# Patient Record
Sex: Male | Born: 1990 | Race: White | Hispanic: No | Marital: Married | Smoking: Never smoker
Health system: Southern US, Community
[De-identification: ages and names within clinical notes are randomized; demographics above are authoritative.]

## PROBLEM LIST (undated history)

## (undated) DIAGNOSIS — J45909 Unspecified asthma, uncomplicated: Secondary | ICD-10-CM

---
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
## 2011-12-18 IMAGING — CR DG HAND COMPLETE 3+V*L*
1 series · 3 of 3 positions shown · non-contrast
Comparison: none

REASON FOR EXAM: foreign body; pt in [place]
COMMENTS:

[Series 1: view not recorded · 0.17mm/px · 3 of 3 slices shown]
[im 1/3]
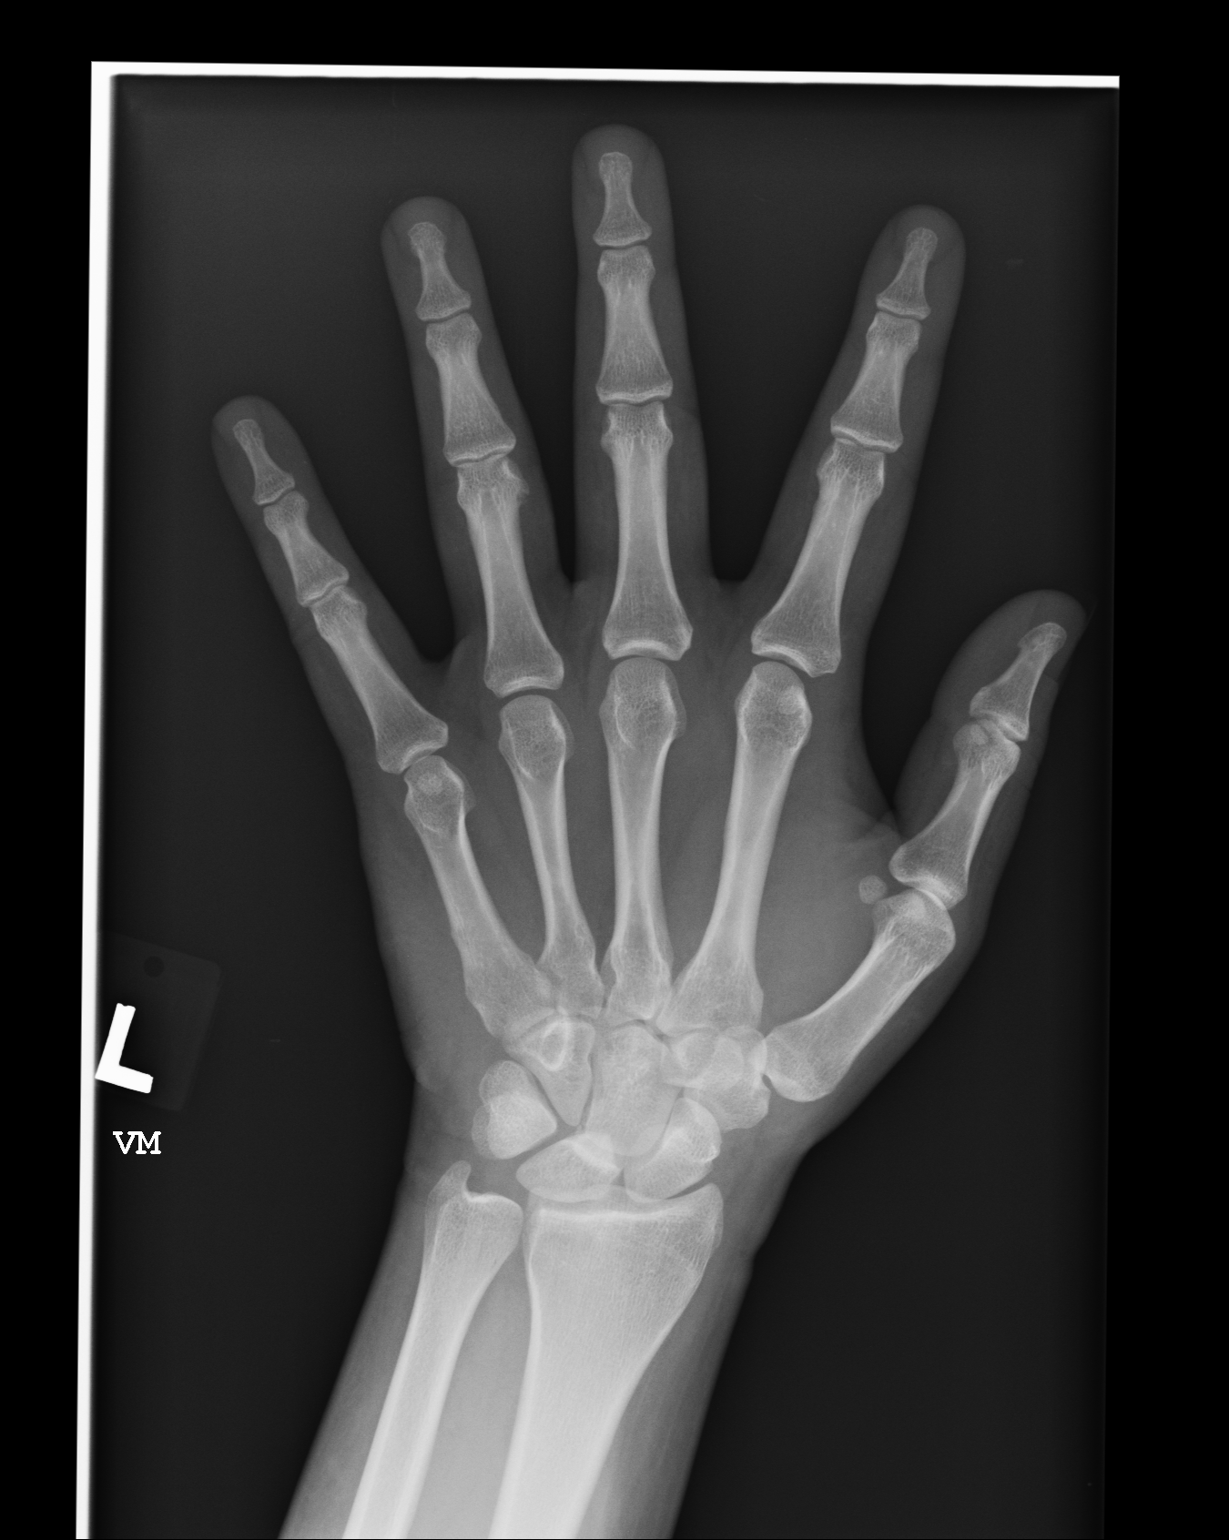
[im 2/3]
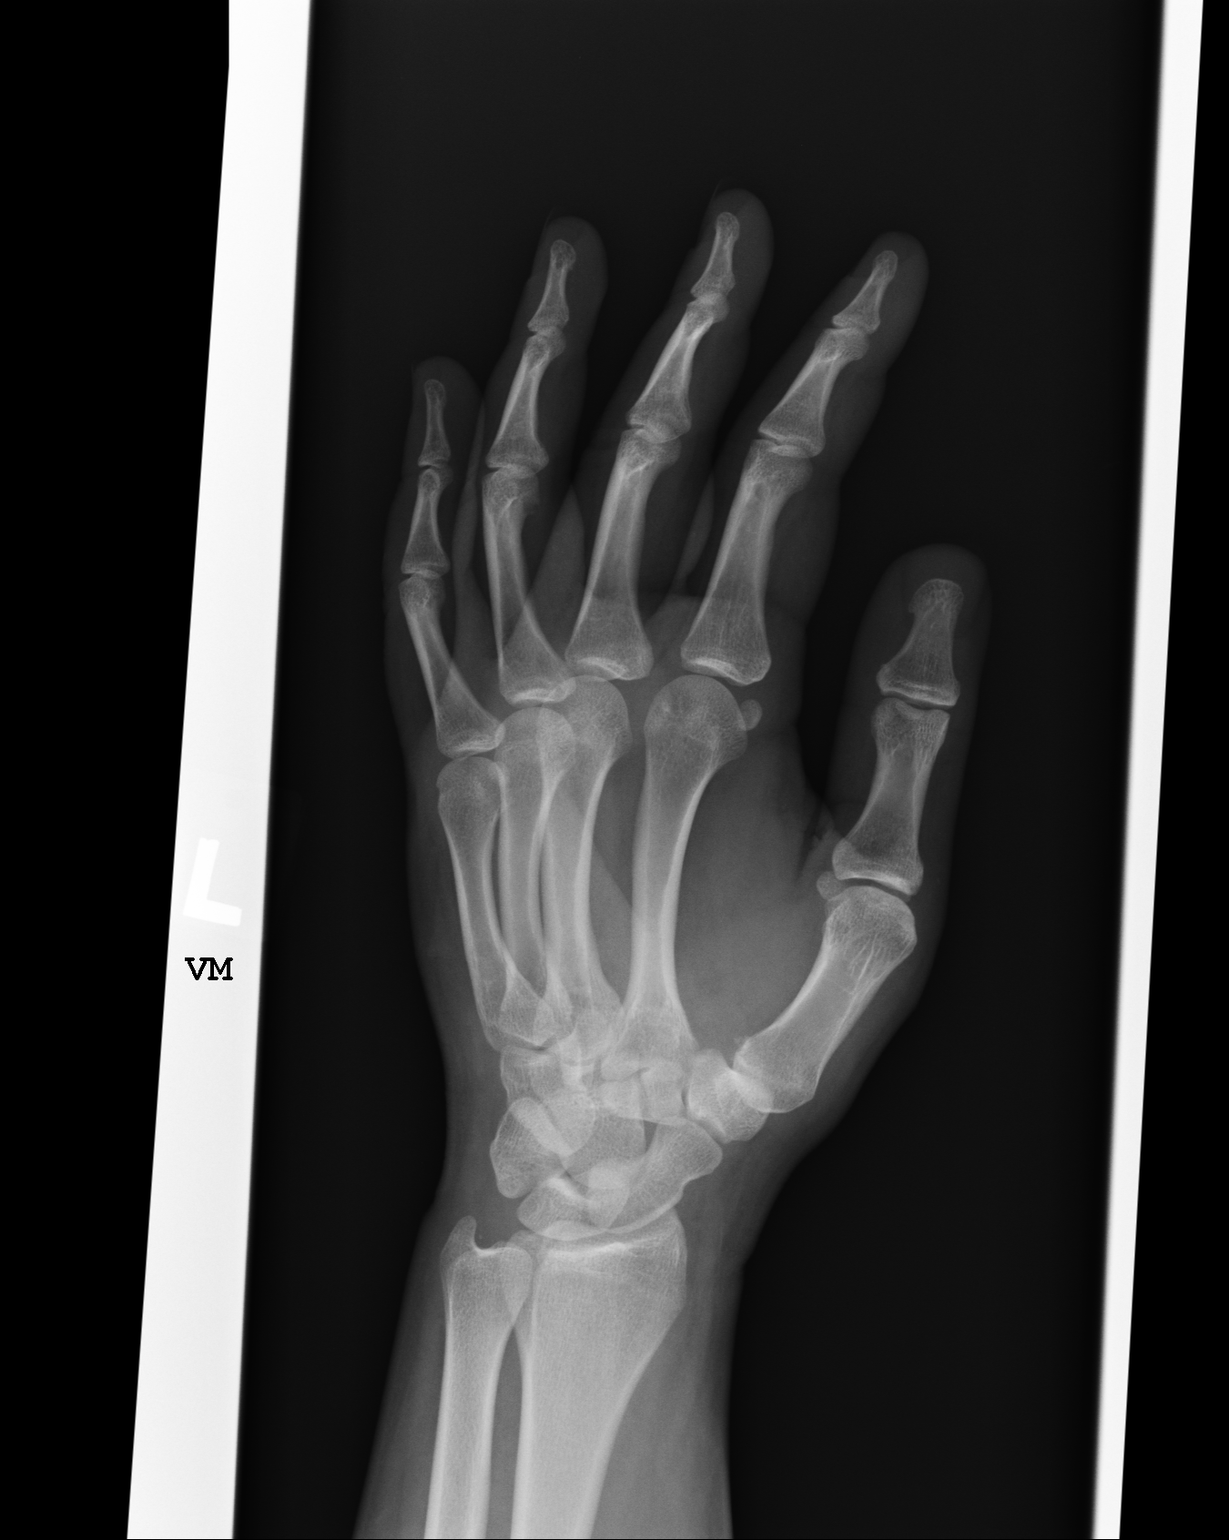
[im 3/3]
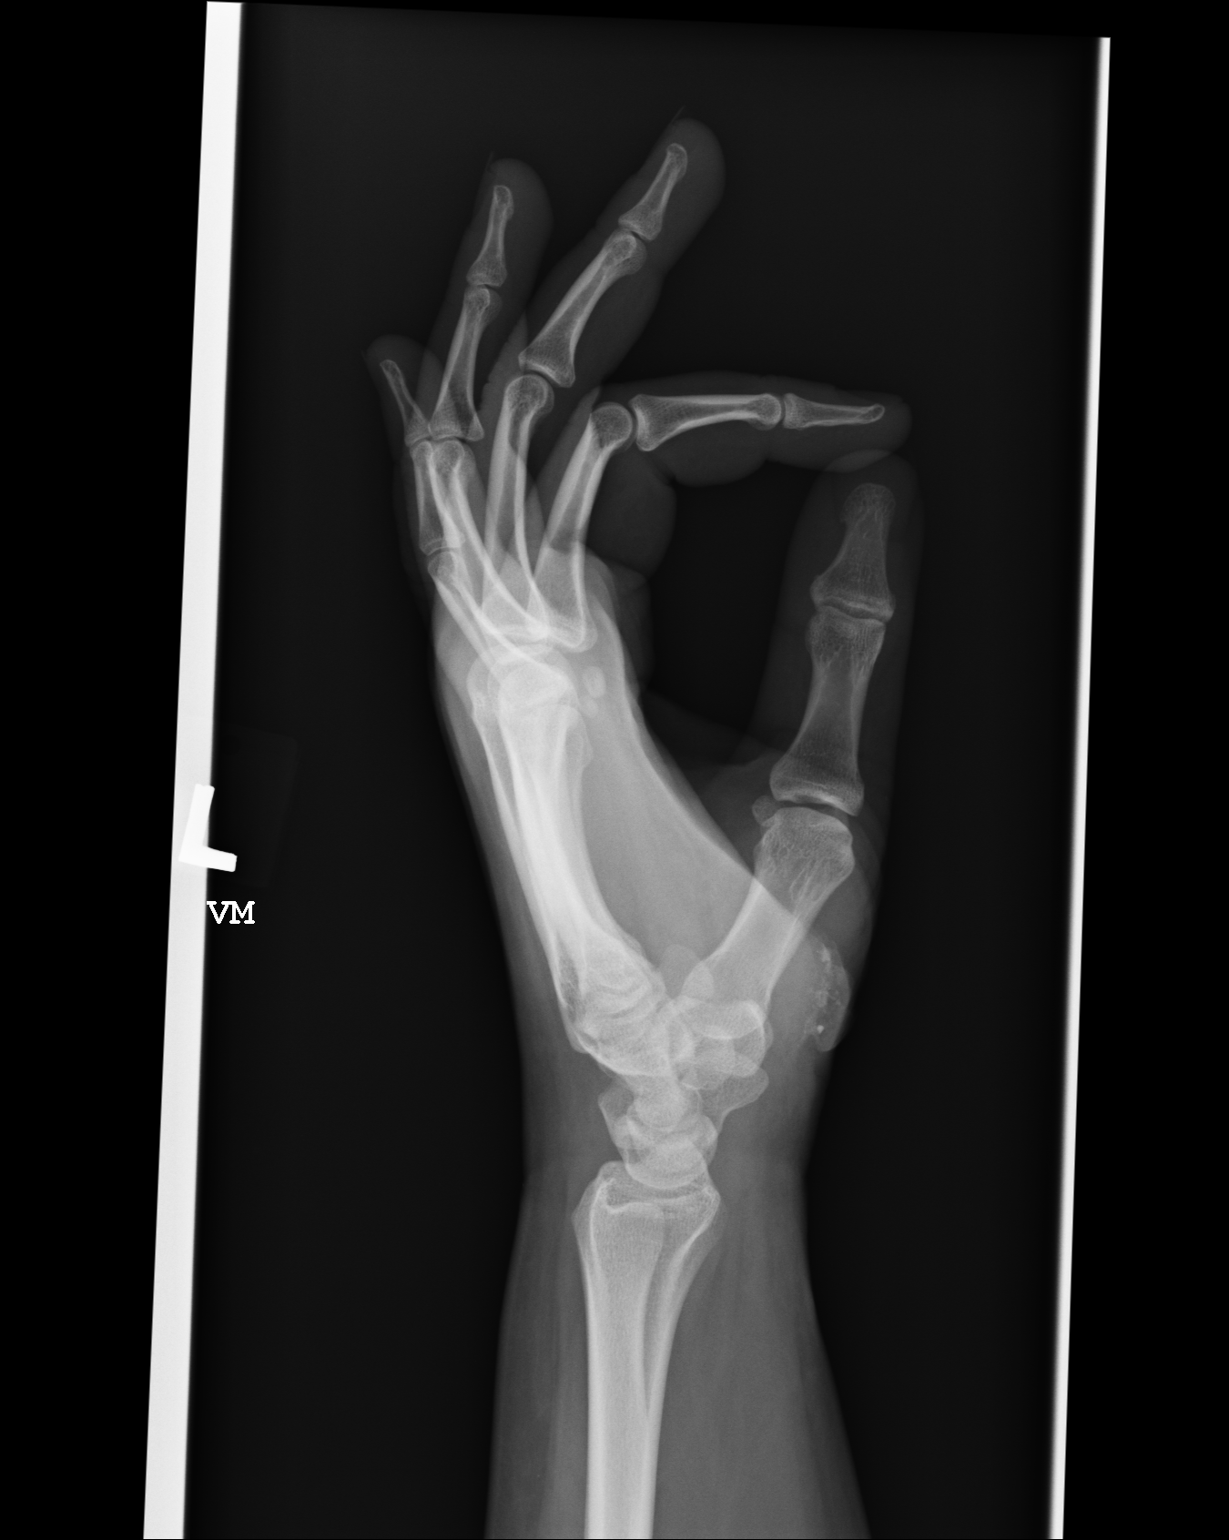

[3 of 3 positions shown; findings below may reference images not displayed]

PROCEDURE:     DXR - DXR HAND LT COMPLETE  W/OBLIQUES  - September 01, 2009 [DATE]

RESULT:     Three views of the left hand reveal the bones to be adequately
mineralized. I do not see evidence of acute bony abnormality. There is
radiodense material in the wound in the thenar region seen on the lateral
film.
IMPRESSION: 1. There may be retained foreign material in the region of the base of the
thumb. However, this is seen on one view only.
2. I do not see evidence of an underlying fracture.

## 2015-03-24 ENCOUNTER — Emergency Department
Admission: EM | Admit: 2015-03-24 | Discharge: 2015-03-24 | Disposition: A | Discharge status: Home / Self Care | Payer: Self-pay | Attending: Emergency Medicine | Admitting: Emergency Medicine

## 2015-03-24 ENCOUNTER — Encounter: Payer: Self-pay | Admitting: Emergency Medicine

## 2015-03-24 DIAGNOSIS — H109 Unspecified conjunctivitis: Secondary | ICD-10-CM | POA: Insufficient documentation

## 2015-03-24 DIAGNOSIS — J45909 Unspecified asthma, uncomplicated: Secondary | ICD-10-CM | POA: Insufficient documentation

## 2015-03-24 HISTORY — DX: Unspecified asthma, uncomplicated: J45.909

## 2015-03-24 MED ORDER — TOBRAMYCIN 0.3 % OP SOLN: 2.0000 [drp] | OPHTHALMIC | Status: AC

## 2015-03-24 NOTE — ED Notes (Signed)
AAOx3.  Skin warm and dry.  Sinus congestion noted.  NAD 

## 2015-03-24 NOTE — ED Notes (Signed)
Pt states about a week ago he started with a small cough, now thinks he has pink eye in both eyes.

## 2015-03-24 NOTE — Discharge Instructions (Signed)
Bacterial Conjunctivitis Bacterial conjunctivitis (commonly called pink eye) is redness, soreness, or puffiness (inflammation) of the white part of your eye. It is caused by a germ called bacteria. These germs can easily spread from person to person (contagious). Your eye often will become red or pink. Your eye may also become irritated, watery, or have a thick discharge.  HOME CARE   Apply a cool, clean washcloth over closed eyelids. Do this for 10-20 minutes, 3-4 times a day while you have pain.  Gently wipe away any fluid coming from the eye with a warm, wet washcloth or cotton ball.  Wash your hands often with soap and water. Use paper towels to dry your hands.  Do not share towels or washcloths.  Change or wash your pillowcase every day.  Do not use eye makeup until the infection is gone.  Do not use machines or drive if your vision is blurry.  Stop using contact lenses. Do not use them again until your doctor says it is okay.  Do not touch the tip of the eye drop bottle or medicine tube with your fingers when you put medicine on the eye. GET HELP RIGHT AWAY IF:   Your eye is not better after 3 days of starting your medicine.  You have a yellowish fluid coming out of the eye.  You have more pain in the eye.  Your eye redness is spreading.  Your vision becomes blurry.  You have a fever or lasting symptoms for more than 2-3 days.  You have a fever and your symptoms suddenly get worse.  You have pain in the face.  Your face gets red or puffy (swollen). MAKE SURE YOU:   Understand these instructions.  Will watch this condition.  Will get help right away if you are not doing well or get worse.   This information is not intended to replace advice given to you by your health care provider. Make sure you discuss any questions you have with your health care provider.   Document Released: 01/06/2008 Document Revised: 03/15/2012 Document Reviewed: 12/03/2011 Elsevier  Interactive Patient Education 2016 [employer]  

## 2015-03-24 NOTE — ED Provider Notes (Signed)
[facility] Emergency Department Provider Note  ____________________________________________  Time seen: Approximately 4:43 PM  I have reviewed the triage vital signs and the nursing notes.   HISTORY  Chief Complaint Cough and Conjunctivitis  HPI Wesley Harvey is a 24 y.o. male is here with complaint of pink eye in both his eyes. Patient states it started in his left eye and now beginning in his right eye. He states that drainage has decreased in his left that in the right eye today he is been getting yellow greenish drainage. He denies any vision changes or foreign bodies. He states he also has had a nonproductive cough and nasal congestion. He denies any fever or chills. He has not taken any over-the-counter medication. He denies any pain and rates it as 0 out of 10.   Past Medical History  Diagnosis Date  . Asthma     There are no active problems to display for this patient.   History reviewed. No pertinent past surgical history.  Current Outpatient Rx  Name  Route  Sig  Dispense  Refill  . tobramycin (TOBREX) 0.3 % ophthalmic solution   Right Eye   Place 2 drops into the right eye every 4 (four) hours. While awake   5 mL   0     Allergies Shellfish allergy  No family history on file.  Social History Social History  Substance Use Topics  . Smoking status: Never Smoker   . Smokeless tobacco: None  . Alcohol Use: Yes     Comment: occas.     Review of Systems Constitutional: No fever/chills Eyes: No visual changes. Positive exudate ENT: No sore throat. Cardiovascular: Denies chest pain. Respiratory: Denies shortness of breath. Positive nonproductive cough Gastrointestinal:  No nausea, no vomiting.   Skin: Negative for rash. Neurological: Negative for headaches, focal weakness or numbness.  10-point ROS otherwise negative.  ____________________________________________   PHYSICAL EXAM:  VITAL SIGNS: ED Triage Vitals  Enc  Vitals Group     BP 03/24/15 1633 147/91 mmHg     Pulse Rate 03/24/15 1633 79     Resp 03/24/15 1633 18     Temp 03/24/15 1633 98.4 F (36.9 C)     Temp Source 03/24/15 1633 Oral     SpO2 03/24/15 1633 99 %     Weight 03/24/15 1633 280 lb (127.007 kg)     Height 03/24/15 1633  (1.88 m)     Head Cir --      Peak Flow --      Pain Score --      Pain Loc --      Pain Edu? --      Excl. in GC? --     Constitutional: Alert and oriented. Well appearing and in no acute distress. Eyes: Right conjunctiva with mild injection and minimal exudate present. Left conjunctiva without exudate. There is minimal to no injection noted on the left side. PERRL. EOMI. Head: Atraumatic. Nose: Mild congestion/rhinnorhea.  EACs and TMs clear bilaterally. Mouth/Throat: Mucous membranes are moist.  Oropharynx non-erythematous. Neck: No stridor.   Hematological/Lymphatic/Immunilogical: No cervical lymphadenopathy. Cardiovascular: Normal rate, regular rhythm. Grossly normal heart sounds.  Good peripheral circulation. Respiratory: Normal respiratory effort.  No retractions. Lungs CTAB. Gastrointestinal: Soft and nontender. No distention. Musculoskeletal: No lower extremity tenderness nor edema.  No joint effusions. Neurologic:  Normal speech and language. No gross focal neurologic deficits are appreciated. No gait instability. Skin:  Skin is warm, dry and intact. No rash  noted. Psychiatric: Mood and affect are normal. Speech and behavior are normal.  ____________________________________________   LABS (all labs ordered are listed, but only abnormal results are displayed)  Labs Reviewed - No data to display ____________________________________________   PROCEDURES  Procedure(s) performed: None  Critical Care performed: No  ____________________________________________   INITIAL IMPRESSION / ASSESSMENT AND PLAN / ED COURSE  Pertinent labs & imaging results that were available during my care  of the patient were reviewed by me and considered in my medical decision making (see chart for details).  He was placed on Tobrex ophthalmic solution to be used in his right eye and to begin his left eye if any problems. Patient was given a note to remain out of work    FINAL CLINICAL IMPRESSION(S) / ED DIAGNOSES  Final diagnoses:  Conjunctivitis, right eye      Tommi RumpsRhonda L Anmol Paschen, PA-C 03/24/15 1709  Emily FilbertJonathan E Maslanka, MD 03/24/15 2002

## 2015-03-24 NOTE — ED Notes (Signed)
AaoX3.  SKIN WARM AND DRY.  NAD 

## 2016-02-16 ENCOUNTER — Encounter: Payer: Self-pay | Admitting: Emergency Medicine

## 2016-02-16 ENCOUNTER — Emergency Department
Admission: EM | Admit: 2016-02-16 | Discharge: 2016-02-16 | Disposition: A | Discharge status: Home / Self Care | Payer: Managed Care, Other (non HMO) | Attending: Emergency Medicine | Admitting: Emergency Medicine

## 2016-02-16 DIAGNOSIS — J020 Streptococcal pharyngitis: Secondary | ICD-10-CM | POA: Insufficient documentation

## 2016-02-16 DIAGNOSIS — J029 Acute pharyngitis, unspecified: Secondary | ICD-10-CM | POA: Diagnosis present

## 2016-02-16 DIAGNOSIS — J45909 Unspecified asthma, uncomplicated: Secondary | ICD-10-CM | POA: Diagnosis not present

## 2016-02-16 DIAGNOSIS — J069 Acute upper respiratory infection, unspecified: Secondary | ICD-10-CM

## 2016-02-16 LAB — POCT RAPID STREP A: Streptococcus, Group A Screen (Direct): POSITIVE — AB

## 2016-02-16 MED ORDER — BENZONATATE 100 MG PO CAPS
200.0000 mg | ORAL_CAPSULE | Freq: Once | ORAL | Status: AC
  Administered 2016-02-16: 200 mg via ORAL
  Filled 2016-02-16: qty 2

## 2016-02-16 MED ORDER — IBUPROFEN 100 MG/5ML PO SUSP
ORAL | Status: AC
  Filled 2016-02-16: qty 40

## 2016-02-16 MED ORDER — BENZONATATE 100 MG PO CAPS: 100.0000 mg | ORAL_CAPSULE | Freq: Three times a day (TID) | ORAL | 0 refills | Status: AC | PRN

## 2016-02-16 MED ORDER — IBUPROFEN 100 MG/5ML PO SUSP
800.0000 mg | Freq: Once | ORAL | Status: AC
  Administered 2016-02-16: 800 mg via ORAL
  Filled 2016-02-16: qty 40

## 2016-02-16 MED ORDER — MAGIC MOUTHWASH W/LIDOCAINE: 5.0000 mL | Freq: Four times a day (QID) | ORAL | 0 refills | Status: AC

## 2016-02-16 MED ORDER — IBUPROFEN 800 MG PO TABS: 800.0000 mg | ORAL_TABLET | Freq: Three times a day (TID) | ORAL | 0 refills | Status: AC | PRN

## 2016-02-16 MED ORDER — AMOXICILLIN 500 MG PO CAPS: 500.0000 mg | ORAL_CAPSULE | Freq: Three times a day (TID) | ORAL | 0 refills | Status: AC

## 2016-02-16 NOTE — ED Triage Notes (Addendum)
Developed sore throat  Body aches and sl fever a few days ago  Increased sore throat today.   Low grade temp on arrival

## 2016-02-16 NOTE — ED Provider Notes (Signed)
[facility] Emergency Department Provider Note   ____________________________________________   First MD Initiated Contact with Patient 02/16/16 1634     (approximate)  I have reviewed the triage vital signs and the nursing notes.   HISTORY  Chief Complaint Sore Throat    HPI Wesley Harvey is a 25 y.o. male patient complaining of sore throat and low-grade fever and body aches for 3 days. Patient states so through increased today. Patient also complaining of nasal congestion and postnasal drainage. Patient state has a nonproductive cough. No palliative measures taken for this complaint. Patient rates his pain as a 4/10. Patient describes pain as "achy".   Past Medical History:  Diagnosis Date  . Asthma     There are no active problems to display for this patient.   History reviewed. No pertinent surgical history.  Prior to Admission medications   Medication Sig Start Date End Date Taking? Authorizing Provider  amoxicillin (AMOXIL) 500 MG capsule Take 1 capsule (500 mg total) by mouth 3 (three) times daily. 02/16/16   Joni Reiningonald K Talbot Monarch, PA-C  benzonatate (TESSALON PERLES) 100 MG capsule Take 1 capsule (100 mg total) by mouth 3 (three) times daily as needed for cough. 02/16/16   Joni Reiningonald K Kyrsten Deleeuw, PA-C  ibuprofen (ADVIL,MOTRIN) 800 MG tablet Take 1 tablet (800 mg total) by mouth every 8 (eight) hours as needed for moderate pain. 02/16/16   Joni Reiningonald K Alyha Marines, PA-C  magic mouthwash w/lidocaine SOLN Take 5 mLs by mouth 4 (four) times daily. 02/16/16   Joni Reiningonald K Jireh Elmore, PA-C  tobramycin (TOBREX) 0.3 % ophthalmic solution Place 2 drops into the right eye every 4 (four) hours. While awake 03/24/15   Tommi Rumpshonda L Summers, PA-C    Allergies Shellfish allergy  No family history on file.  Social History Social History  Substance Use Topics  . Smoking status: Never Smoker  . Smokeless tobacco: Never Used  . Alcohol use Yes     Comment: occas.     Review of  Systems Constitutional: No fever/chills.  Body aches Eyes: No visual changes. ZOX:WRUEENT:Sore throat Cardiovascular: Denies chest pain. Respiratory: Denies shortness of breath. Gastrointestinal: No abdominal pain.  No nausea, no vomiting.  No diarrhea.  No constipation. Genitourinary: Negative for dysuria. Musculoskeletal: Negative for back pain. Skin: Negative for rash. Neurological: Frontal headache but denies focal weakness or numbness.    ____________________________________________   PHYSICAL EXAM:  VITAL SIGNS: ED Triage Vitals  Enc Vitals Group     BP 02/16/16 1540 133/84     Pulse Rate 02/16/16 1540 (!) 102     Resp 02/16/16 1540 20     Temp 02/16/16 1540 99.5 F (37.5 C)     Temp Source 02/16/16 1540 Oral     SpO2 02/16/16 1540 99 %     Weight 02/16/16 1540 270 lb (122.5 kg)     Height 02/16/16 1540 6\' 4"  (1.93 m)     Head Circumference --      Peak Flow --      Pain Score 02/16/16 1543 4     Pain Loc --      Pain Edu? --      Excl. in GC? --     Constitutional: Alert and oriented. Well appearing and in no acute distress. Eyes: Conjunctivae are normal. PERRL. EOMI. Head: Atraumatic. Nose: Edematous nasal turbinates with clear rhinorrhea Mouth/Throat: Mucous membranes are moist.  Oropharynx non-erythematous.Edematous but not exudative tonsils. Neck: No stridor.  No cervical spine tenderness to palpation.  Hematological/Lymphatic/Immunilogical: Bilateral cervical lymphadenopathy. Cardiovascular: Normal rate, regular rhythm. Grossly normal heart sounds.  Good peripheral circulation. Respiratory: Normal respiratory effort.  No retractions. Lungs CTAB. Nonproductive cough. Gastrointestinal: Soft and nontender. No distention. No abdominal bruits. No CVA tenderness. Musculoskeletal: No lower extremity tenderness nor edema.  No joint effusions. Neurologic:  Normal speech and language. No gross focal neurologic deficits are appreciated. No gait instability. Skin:  Skin is  warm, dry and intact. No rash noted. Psychiatric: Mood and affect are normal. Speech and behavior are normal.  ____________________________________________   LABS (all labs ordered are listed, but only abnormal results are displayed)  Labs Reviewed  POCT RAPID STREP A - Abnormal; Notable for the following:       Result Value   Streptococcus, Group A Screen (Direct) POSITIVE (*)    All other components within normal limits   ____________________________________________  EKG   ____________________________________________  RADIOLOGY   ____________________________________________   PROCEDURES  Procedure(s) performed: None  Procedures  Critical Care performed: No  ____________________________________________   INITIAL IMPRESSION / ASSESSMENT AND PLAN / ED COURSE  Pertinent labs & imaging results that were available during my care of the patient were reviewed by me and considered in my medical decision making (see chart for details).  Strep pharyngitis. Patient given discharge care instructions. Patient given prescription for amoxicillin, Magic mouthwash, Tessalon, and ibuprofen. Patient given a work note. Patient advised to follow-up with family doctor condition persists.  Clinical Course      ____________________________________________   FINAL CLINICAL IMPRESSION(S) / ED DIAGNOSES  Final diagnoses:  Strep pharyngitis  Viral upper respiratory tract infection      NEW MEDICATIONS STARTED DURING THIS VISIT:  New Prescriptions   AMOXICILLIN (AMOXIL) 500 MG CAPSULE    Take 1 capsule (500 mg total) by mouth 3 (three) times daily.   BENZONATATE (TESSALON PERLES) 100 MG CAPSULE    Take 1 capsule (100 mg total) by mouth 3 (three) times daily as needed for cough.   IBUPROFEN (ADVIL,MOTRIN) 800 MG TABLET    Take 1 tablet (800 mg total) by mouth every 8 (eight) hours as needed for moderate pain.   MAGIC MOUTHWASH W/LIDOCAINE SOLN    Take 5 mLs by mouth 4 (four)  times daily.     Note:  This document was prepared using Dragon voice recognition software and may include unintentional dictation errors.    Joni ReiningRonald K Jeanclaude Wentworth, PA-C 02/16/16 1710    Jene Everyobert Kinner, MD 02/17/16 1415

## 2016-10-31 ENCOUNTER — Emergency Department
Admission: EM | Admit: 2016-10-31 | Discharge: 2016-10-31 | Disposition: A | Discharge status: Home / Self Care | Payer: 59 | Attending: Emergency Medicine | Admitting: Emergency Medicine

## 2016-10-31 DIAGNOSIS — J45909 Unspecified asthma, uncomplicated: Secondary | ICD-10-CM | POA: Diagnosis not present

## 2016-10-31 DIAGNOSIS — S0502XA Injury of conjunctiva and corneal abrasion without foreign body, left eye, initial encounter: Secondary | ICD-10-CM | POA: Insufficient documentation

## 2016-10-31 DIAGNOSIS — W228XXA Striking against or struck by other objects, initial encounter: Secondary | ICD-10-CM | POA: Insufficient documentation

## 2016-10-31 DIAGNOSIS — Y999 Unspecified external cause status: Secondary | ICD-10-CM | POA: Diagnosis not present

## 2016-10-31 DIAGNOSIS — Z79899 Other long term (current) drug therapy: Secondary | ICD-10-CM | POA: Diagnosis not present

## 2016-10-31 DIAGNOSIS — Y9383 Activity, rough housing and horseplay: Secondary | ICD-10-CM | POA: Insufficient documentation

## 2016-10-31 DIAGNOSIS — Y929 Unspecified place or not applicable: Secondary | ICD-10-CM | POA: Insufficient documentation

## 2016-10-31 DIAGNOSIS — S0592XA Unspecified injury of left eye and orbit, initial encounter: Secondary | ICD-10-CM | POA: Diagnosis present

## 2016-10-31 MED ORDER — FLUORESCEIN SODIUM 0.6 MG OP STRP
1.0000 | ORAL_STRIP | Freq: Once | OPHTHALMIC | Status: AC
  Administered 2016-10-31: 1 via OPHTHALMIC

## 2016-10-31 MED ORDER — TETRACAINE HCL 0.5 % OP SOLN
2.0000 [drp] | Freq: Once | OPHTHALMIC | Status: AC
  Administered 2016-10-31: 2 [drp] via OPHTHALMIC

## 2016-10-31 MED ORDER — TETRACAINE HCL 0.5 % OP SOLN
OPHTHALMIC | Status: AC
  Administered 2016-10-31: 2 [drp] via OPHTHALMIC
  Filled 2016-10-31: qty 4

## 2016-10-31 MED ORDER — EYE WASH OPHTH SOLN
1.0000 [drp] | OPHTHALMIC | Status: DC | PRN
  Administered 2016-10-31: 1 [drp] via OPHTHALMIC

## 2016-10-31 MED ORDER — FLUORESCEIN SODIUM 0.6 MG OP STRP
ORAL_STRIP | OPHTHALMIC | Status: AC
Start: 2016-10-31 — End: 2016-10-31
  Administered 2016-10-31: 1 via OPHTHALMIC
  Filled 2016-10-31: qty 1

## 2016-10-31 MED ORDER — EYE WASH OPHTH SOLN
OPHTHALMIC | Status: AC
  Administered 2016-10-31: 1 [drp] via OPHTHALMIC
  Filled 2016-10-31: qty 118

## 2016-10-31 MED ORDER — GENTAMICIN SULFATE 0.3 % OP SOLN: 1.0000 [drp] | OPHTHALMIC | 0 refills | Status: AC

## 2016-10-31 NOTE — ED Provider Notes (Signed)
[facility] Emergency Department Provider Note   ____________________________________________   First MD Initiated Contact with Patient 10/31/16 1759     (approximate)  I have reviewed the triage vital signs and the nursing notes.   HISTORY  Chief Complaint Eye Pain    HPI Wesley Harvey is a 26 y.o. male patient complaining of left eye pain with foreign body sensation. Patient he was playing with his son Carlynn Heraldkeychain got stuck and struck him in the eye. Patient denies vision loss. Patient say eyes full and watering.   Past Medical History:  Diagnosis Date  . Asthma     There are no active problems to display for this patient.   History reviewed. No pertinent surgical history.  Prior to Admission medications   Medication Sig Start Date End Date Taking? Authorizing Provider  amoxicillin (AMOXIL) 500 MG capsule Take 1 capsule (500 mg total) by mouth 3 (three) times daily. 02/16/16   Joni ReiningSmith, Ronald K, PA-C  benzonatate (TESSALON PERLES) 100 MG capsule Take 1 capsule (100 mg total) by mouth 3 (three) times daily as needed for cough. 02/16/16   Joni ReiningSmith, Ronald K, PA-C  gentamicin (GARAMYCIN) 0.3 % ophthalmic solution Place 1 drop into the left eye every 4 (four) hours. 10/31/16   Joni ReiningSmith, Ronald K, PA-C  ibuprofen (ADVIL,MOTRIN) 800 MG tablet Take 1 tablet (800 mg total) by mouth every 8 (eight) hours as needed for moderate pain. 02/16/16   Joni ReiningSmith, Ronald K, PA-C  magic mouthwash w/lidocaine SOLN Take 5 mLs by mouth 4 (four) times daily. 02/16/16   Joni ReiningSmith, Ronald K, PA-C  tobramycin (TOBREX) 0.3 % ophthalmic solution Place 2 drops into the right eye every 4 (four) hours. While awake 03/24/15   Tommi RumpsSummers, Rhonda L, PA-C    Allergies Shellfish allergy  No family history on file.  Social History Social History  Substance Use Topics  . Smoking status: Never Smoker  . Smokeless tobacco: Never Used  . Alcohol use Yes     Comment: occas.     Review of  Systems Constitutional: No fever/chills Eyes:Left eye pain ENT: No sore throat. Cardiovascular: Denies chest pain. Respiratory: Denies shortness of breath. Gastrointestinal: No abdominal pain.  No nausea, no vomiting.  No diarrhea.  No constipation. Genitourinary: Negative for dysuria. Musculoskeletal: Negative for back pain. Skin: Negative for rash. Neurological: Negative for headaches, focal weakness or numbness. {**Psychiatric: Endocrine: Hematological/Lymphatic: Allergic/Immunilogical: Shellfish ____________________________________________   PHYSICAL EXAM:  VITAL SIGNS: ED Triage Vitals  Enc Vitals Group     BP 10/31/16 1723 (!) 144/88     Pulse Rate 10/31/16 1723 66     Resp 10/31/16 1723 16     Temp 10/31/16 1723 98.2 F (36.8 C)     Temp Source 10/31/16 1723 Oral     SpO2 10/31/16 1723 98 %     Weight 10/31/16 1724 286 lb (129.7 kg)     Height 10/31/16 1724 6\' 2"  (1.88 m)     Head Circumference --      Peak Flow --      Pain Score 10/31/16 1723 10     Pain Loc --      Pain Edu? --      Excl. in GC? --     Constitutional: Alert and oriented. Well appearing and in no acute distress. Eyes: Staining of the left eye revealed cornea abrasion.Marland Kitchen. PERRL. EOMI. Cardiovascular: Normal rate, regular rhythm. Grossly normal heart sounds.  Good peripheral circulation. Respiratory: Normal respiratory effort.  No retractions.  Lungs CTAB. Neurologic:  Normal speech and language. No gross focal neurologic deficits are appreciated. No gait instability. Skin:  Skin is warm, dry and intact. No rash noted. Psychiatric: Mood and affect are normal. Speech and behavior are normal.  ____________________________________________   LABS (all labs ordered are listed, but only abnormal results are displayed)  Labs Reviewed - No data to display ____________________________________________  EKG   ____________________________________________  RADIOLOGY  No results  found.  ____________________________________________   PROCEDURES  Procedure(s) performed: None  Procedures  Critical Care performed: No  ____________________________________________   INITIAL IMPRESSION / ASSESSMENT AND PLAN / ED COURSE  Pertinent labs & imaging results that were available during my care of the patient were reviewed by me and considered in my medical decision making (see chart for details).  Left corneal abrasion. Patient given discharge care instructions. Patient advised to follow-up with [facility] if no improvement in 2-3 days. Return by ER if condition worsens.      ____________________________________________   FINAL CLINICAL IMPRESSION(S) / ED DIAGNOSES  Final diagnoses:  Abrasion of left cornea, initial encounter      NEW MEDICATIONS STARTED DURING THIS VISIT:  New Prescriptions   GENTAMICIN (GARAMYCIN) 0.3 % OPHTHALMIC SOLUTION    Place 1 drop into the left eye every 4 (four) hours.     Note:  This document was prepared using Dragon voice recognition software and may include unintentional dictation errors.    Joni Reining, PA-C 10/31/16 1810    Jene Every, MD 10/31/16 [phone]

## 2016-10-31 NOTE — ED Triage Notes (Signed)
Pt reports he was playing with his son with a key chain and the key chain was struck and hit him in the left eye - since then the left eye has been watering and increasingly painful

## 2016-10-31 NOTE — ED Notes (Signed)
Right eye pain since around noon today, pt states a key chain that was hanging from his hat hit him in the eye. Pt states he is unable to open his eye without having pain.  Pt states pain is 9/10 currently.  Pt holding towel to eye and has eyes closed.

## 2024-01-29 ENCOUNTER — Emergency Department (HOSPITAL_COMMUNITY)
Admission: EM | Admit: 2024-01-29 | Discharge: 2024-01-29 | Disposition: A | Discharge status: Home / Self Care | Payer: Self-pay | Attending: Emergency Medicine | Admitting: Emergency Medicine

## 2024-01-29 ENCOUNTER — Emergency Department (HOSPITAL_COMMUNITY): Payer: Self-pay

## 2024-01-29 ENCOUNTER — Encounter (HOSPITAL_COMMUNITY): Payer: Self-pay

## 2024-01-29 DIAGNOSIS — M5416 Radiculopathy, lumbar region: Secondary | ICD-10-CM | POA: Insufficient documentation

## 2024-01-29 MED ORDER — METHOCARBAMOL 500 MG PO TABS: 500.0000 mg | ORAL_TABLET | Freq: Two times a day (BID) | ORAL | 0 refills | Status: AC

## 2024-01-29 MED ORDER — KETOROLAC TROMETHAMINE 15 MG/ML IJ SOLN
15.0000 mg | Freq: Once | INTRAMUSCULAR | Status: AC
  Administered 2024-01-29: 15 mg via INTRAMUSCULAR
  Filled 2024-01-29: qty 1

## 2024-01-29 MED ORDER — PREDNISONE 10 MG (21) PO TBPK: ORAL_TABLET | Freq: Every day | ORAL | 0 refills | Status: AC

## 2024-01-29 MED ORDER — METHYLPREDNISOLONE ACETATE 40 MG/ML IJ SUSP
40.0000 mg | Freq: Once | INTRAMUSCULAR | Status: AC
  Administered 2024-01-29: 40 mg via INTRAMUSCULAR
  Filled 2024-01-29: qty 1

## 2024-01-29 MED ORDER — LIDOCAINE 5 % EX PTCH
2.0000 | MEDICATED_PATCH | CUTANEOUS | Status: DC
  Administered 2024-01-29: 2 via TRANSDERMAL
  Filled 2024-01-29: qty 2

## 2024-01-29 NOTE — ED Triage Notes (Signed)
 Patient arrived with complaints of left hip pain that radiates down his leg that started on Friday. States it feels like nerve pain. Took an oxycodone with some relief, but doesn't like taking it.

## 2024-01-29 NOTE — Discharge Instructions (Addendum)
 Home to rest.  Recommend warm compress or heating pad between inside time, followed with gentle's exercises, see discharge instructions for examples. Light activity as tolerated. You can apply over-the-counter lidocaine  patches as directed.  Continue with Motrin  and Tylenol as directed. Take Robaxin as needed as prescribed for pain and back, do not drive operate machinery while take this medication. Take prednisone daily as prescribed. Follow-up with orthopedics if not improving.

## 2024-01-29 NOTE — ED Provider Notes (Signed)
 [place] EMERGENCY DEPARTMENT AT [facility] Provider Note   CSN: [id number] Arrival date & time: 01/29/24  1943     Patient presents with: Leg Pain   Wesley Harvey is a 33 y.o. male.   33 year old male presents with complaint of pain across his lower back, down left leg lateral leg to knee with dull aching knee as well as now spread down right leg.  Denies falls or injuries, no documented fevers, no abdominal pain, loss of bowel or bladder control or saddle paresthesias.  Patient has taken taking tizanidine and oxycodone with limited relief.  No prior back injuries other than occasional pulled muscle.  Onset Friday, worse with movement/changing position.  No history of IV drug abuse.       Prior to Admission medications   Medication Sig Start Date End Date Taking? Authorizing Provider  methocarbamol (ROBAXIN) 500 MG tablet Take 1 tablet (500 mg total) by mouth 2 (two) times daily. 01/29/24  Yes Beverley Leita LABOR, PA-C  predniSONE (STERAPRED UNI-PAK 21 TAB) 10 MG (21) TBPK tablet Take by mouth daily. Take 6 tabs by mouth daily  for 2 days, then 5 tabs for 2 days, then 4 tabs for 2 days, then 3 tabs for 2 days, 2 tabs for 2 days, then 1 tab by mouth daily for 2 days 01/29/24  Yes Beverley Leita LABOR, PA-C  amoxicillin  (AMOXIL ) 500 MG capsule Take 1 capsule (500 mg total) by mouth 3 (three) times daily. 02/16/16   Claudene Tanda POUR, PA-C  benzonatate  (TESSALON  PERLES) 100 MG capsule Take 1 capsule (100 mg total) by mouth 3 (three) times daily as needed for cough. 02/16/16   Claudene Tanda POUR, PA-C  gentamicin  (GARAMYCIN ) 0.3 % ophthalmic solution Place 1 drop into the left eye every 4 (four) hours. 10/31/16   Claudene Tanda POUR, PA-C  ibuprofen  (ADVIL ,MOTRIN ) 800 MG tablet Take 1 tablet (800 mg total) by mouth every 8 (eight) hours as needed for moderate pain. 02/16/16   Claudene Tanda POUR, PA-C  magic mouthwash w/lidocaine  SOLN Take 5 mLs by mouth 4 (four) times daily. 02/16/16   Claudene Tanda POUR, PA-C  tobramycin  (TOBREX ) 0.3 % ophthalmic solution Place 2 drops into the right eye every 4 (four) hours. While awake 03/24/15   Saunders Shona CROME, PA-C    Allergies: Shellfish allergy    Review of Systems Negative except as per HPI Updated Vital Signs BP (!) 154/116 (BP Location: Left Arm)   Pulse 82   Temp 99.1 F (37.3 C) (Oral)   Resp 18   Ht 6' 1 (1.854 m)   Wt 122.5 kg   SpO2 99%   BMI 35.62 kg/m   Physical Exam Vitals and nursing note reviewed.  Constitutional:      General: He is not in acute distress.    Appearance: He is well-developed. He is not diaphoretic.  HENT:     Head: Normocephalic and atraumatic.  Cardiovascular:     Pulses: Normal pulses.  Pulmonary:     Effort: Pulmonary effort is normal.  Abdominal:     Palpations: Abdomen is soft.     Tenderness: There is no abdominal tenderness.  Musculoskeletal:        General: Tenderness present. No swelling or deformity.     Lumbar back: Positive left straight leg raise test. Negative right straight leg raise test.       Back:     Right lower leg: No edema.     Left lower leg:  No edema.  Skin:    General: Skin is warm and dry.     Findings: No erythema or rash.  Neurological:     Mental Status: He is alert and oriented to person, place, and time.     Sensory: No sensory deficit.     Motor: No weakness.     Gait: Gait normal.  Psychiatric:        Behavior: Behavior normal.     (all labs ordered are listed, but only abnormal results are displayed) Labs Reviewed - No data to display  EKG: None  Radiology: DG Lumbar Spine Complete Result Date: 01/29/2024 EXAM: 5 VIEW(S) XRAY OF THE LUMBAR SPINE 01/29/2024 09:22:32 PM COMPARISON: None available. CLINICAL HISTORY: low back pain, lumbar radiculopathy. Since Friday: persistent Lumbar region pain that radiates to the LT side and down the leg FINDINGS: LUMBAR SPINE: BONES: No acute fracture. No aggressive appearing osseous lesion. Alignment  is normal. Mild spurring of the endplates from L3 through L5. Both SI joints are patent. Intact visualized pelvis. DISCS AND DEGENERATIVE CHANGES: No severe degenerative changes. Mild disc space loss and facet joint spurring at the lowest 2 levels. The discs are normal in height above L4. SOFT TISSUES: No acute abnormality. No visible nephrolithiasis. IMPRESSION: 1. No acute osseous abnormality identified. 2. 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 Electronically signed by: Francis Quam MD 01/29/2024 09:28 PM EDT RP Workstation: [id number]     Procedures   Medications Ordered in the ED  lidocaine  (LIDODERM ) 5 % 2 patch (2 patches Transdermal Patch Applied 01/29/24 2155)  ketorolac (TORADOL) 15 MG/ML injection 15 mg (15 mg Intramuscular Given 01/29/24 2059)  methylPREDNISolone acetate (DEPO-MEDROL) injection 40 mg (40 mg Intramuscular Given 01/29/24 2103)                                    Medical Decision Making Amount and/or Complexity of Data Reviewed Radiology: ordered.  Risk Prescription drug management.   33 year old male with complaint of pain across lower back radiating down bilateral lower legs to the level of the knee, left worse than right.  No red flags.  Does have tenderness across low back and bilateral SIs, straight leg raise positive on left.  Reflexes intact, sensation intact, abdomen soft nontender.  Equal leg strength.  Patient is provided with IM Depo-Medrol, Toradol, Lidoderm  patch.  X-ray of the lumbar spine as ordered by self with arthritic changes, agree with radiology interpretation.  Discharged with Robaxin, discontinue his tizanidine.  Also prednisone taper.  Recommend warm compresses, gentle stretching, follow-up with PCP for recheck.     Final diagnoses:  Lumbar radiculopathy, acute    ED Discharge Orders          Ordered    methocarbamol (ROBAXIN) 500 MG tablet  2 times daily        01/29/24 2153    predniSONE (STERAPRED UNI-PAK 21 TAB) 10  MG (21) TBPK tablet  Daily        01/29/24 2153               Beverley Leita LABOR, PA-C 01/29/24 2204    Horton, Roxie HERO, DO 01/29/24 2344
